# Patient Record
Sex: Male | Born: 1955 | Race: White | Hispanic: No | Marital: Married
Health system: Southern US, Community
[De-identification: ages and names within clinical notes are randomized; demographics above are authoritative.]

---

## 2003-12-28 ENCOUNTER — Ambulatory Visit (HOSPITAL_COMMUNITY): Admission: RE | Admit: 2003-12-28 | Discharge: 2003-12-28 | Payer: Self-pay | Admitting: Family Medicine

## 2004-03-04 ENCOUNTER — Ambulatory Visit: Payer: Self-pay | Admitting: Gastroenterology

## 2004-04-04 ENCOUNTER — Ambulatory Visit: Payer: Self-pay | Admitting: Gastroenterology

## 2004-04-14 ENCOUNTER — Ambulatory Visit: Payer: Self-pay | Admitting: Gastroenterology

## 2004-05-07 ENCOUNTER — Ambulatory Visit: Payer: Self-pay | Admitting: Gastroenterology

## 2004-05-16 ENCOUNTER — Ambulatory Visit (HOSPITAL_COMMUNITY): Admission: RE | Admit: 2004-05-16 | Discharge: 2004-05-16 | Payer: Self-pay | Admitting: Gastroenterology

## 2017-08-03 ENCOUNTER — Ambulatory Visit
Admission: RE | Admit: 2017-08-03 | Discharge: 2017-08-03 | Disposition: A | Discharge status: Home / Self Care | Payer: No Typology Code available for payment source | Source: Ambulatory Visit | Attending: Family Medicine | Admitting: Family Medicine

## 2017-08-03 ENCOUNTER — Other Ambulatory Visit: Payer: Self-pay | Admitting: Family Medicine

## 2017-08-03 DIAGNOSIS — M1990 Unspecified osteoarthritis, unspecified site: Secondary | ICD-10-CM

## 2019-11-02 IMAGING — CR DG HAND COMPLETE 3+V*R*
3 series · 3 of 3 positions shown · non-contrast
Comparison: None.

CLINICAL DATA: Bilateral hand pain for 8 months

EXAM:
RIGHT HAND - COMPLETE 3+ VIEW

[x hand pa right]
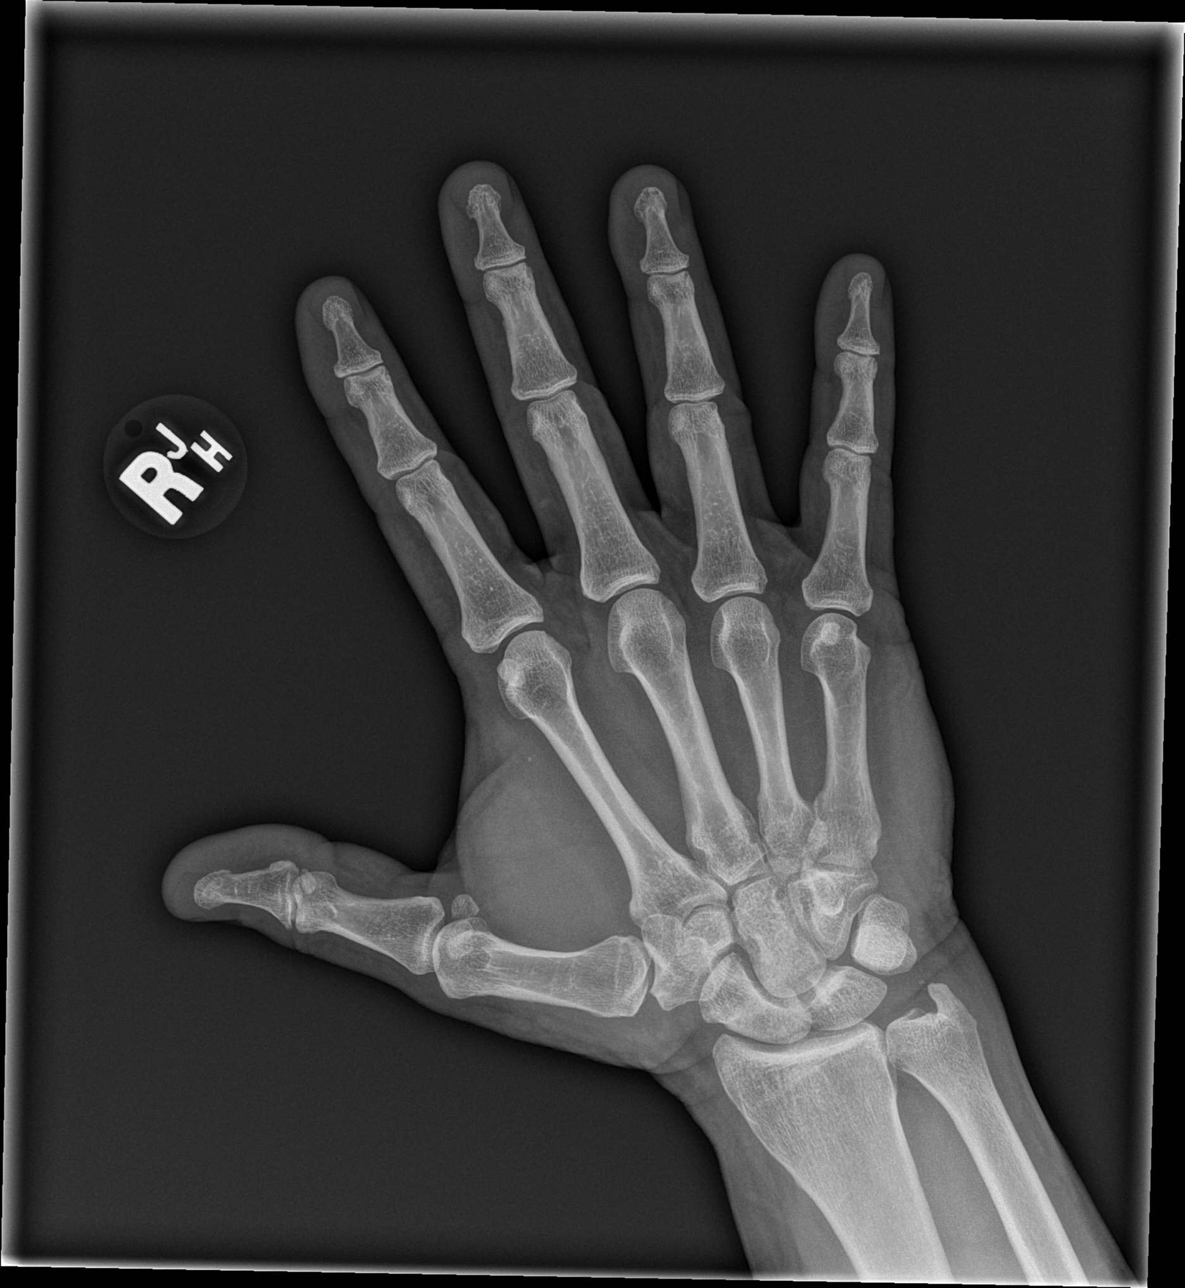

[x hand obl right]
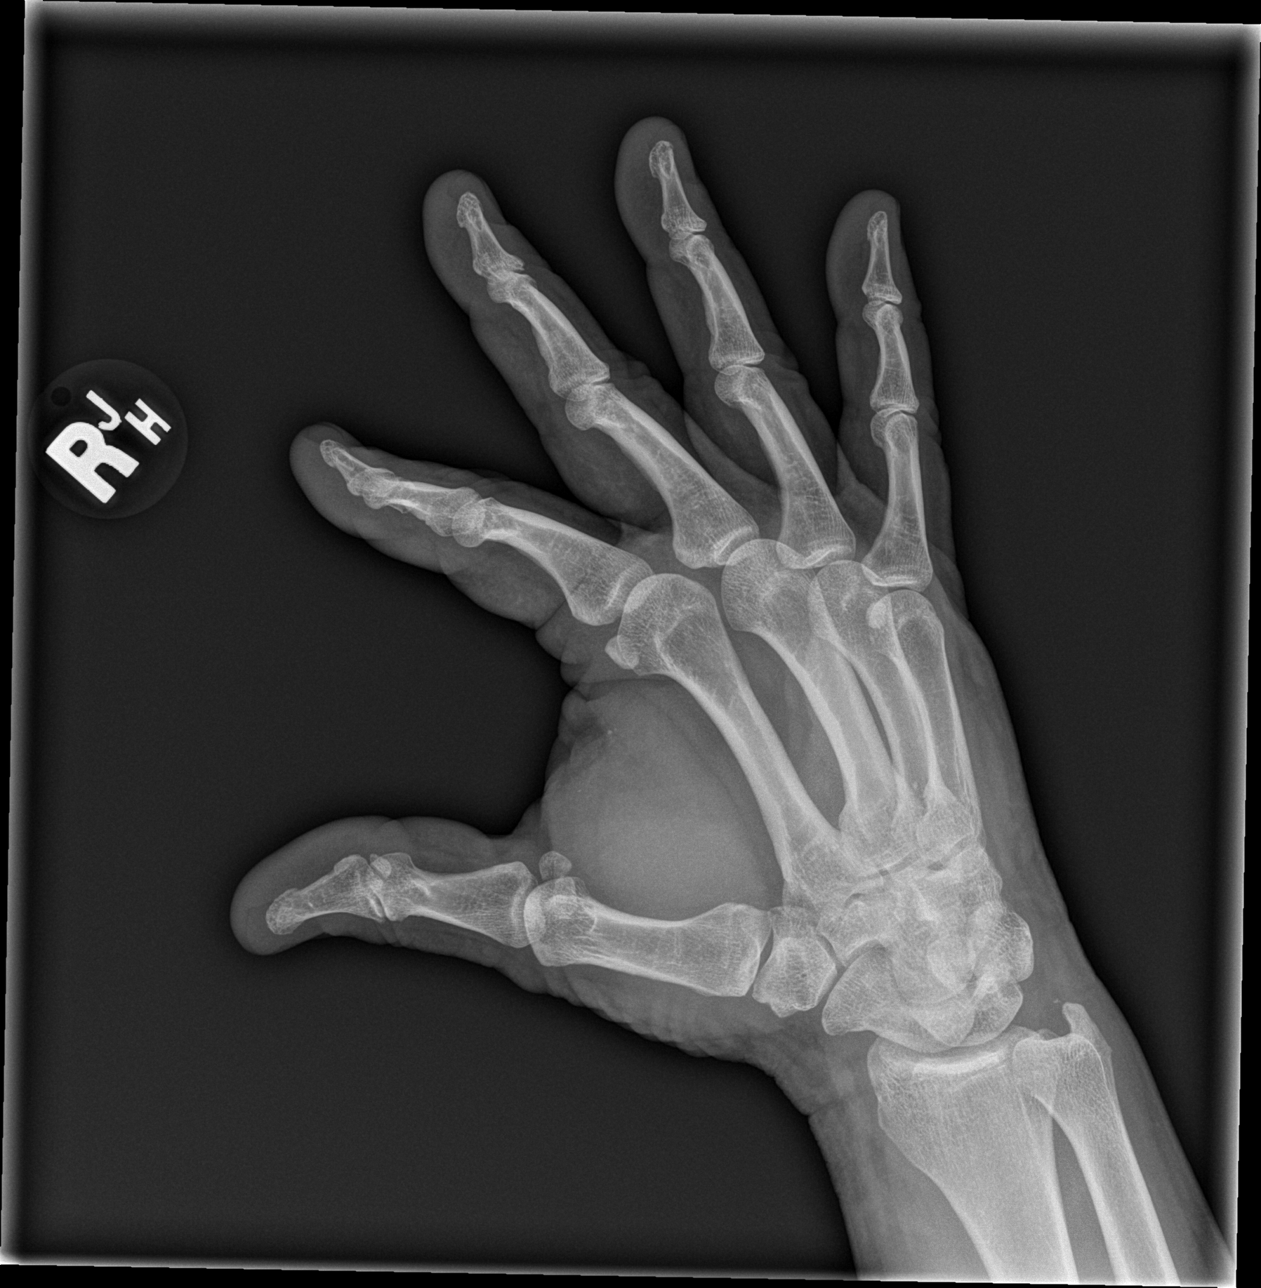

[x hand lat right]
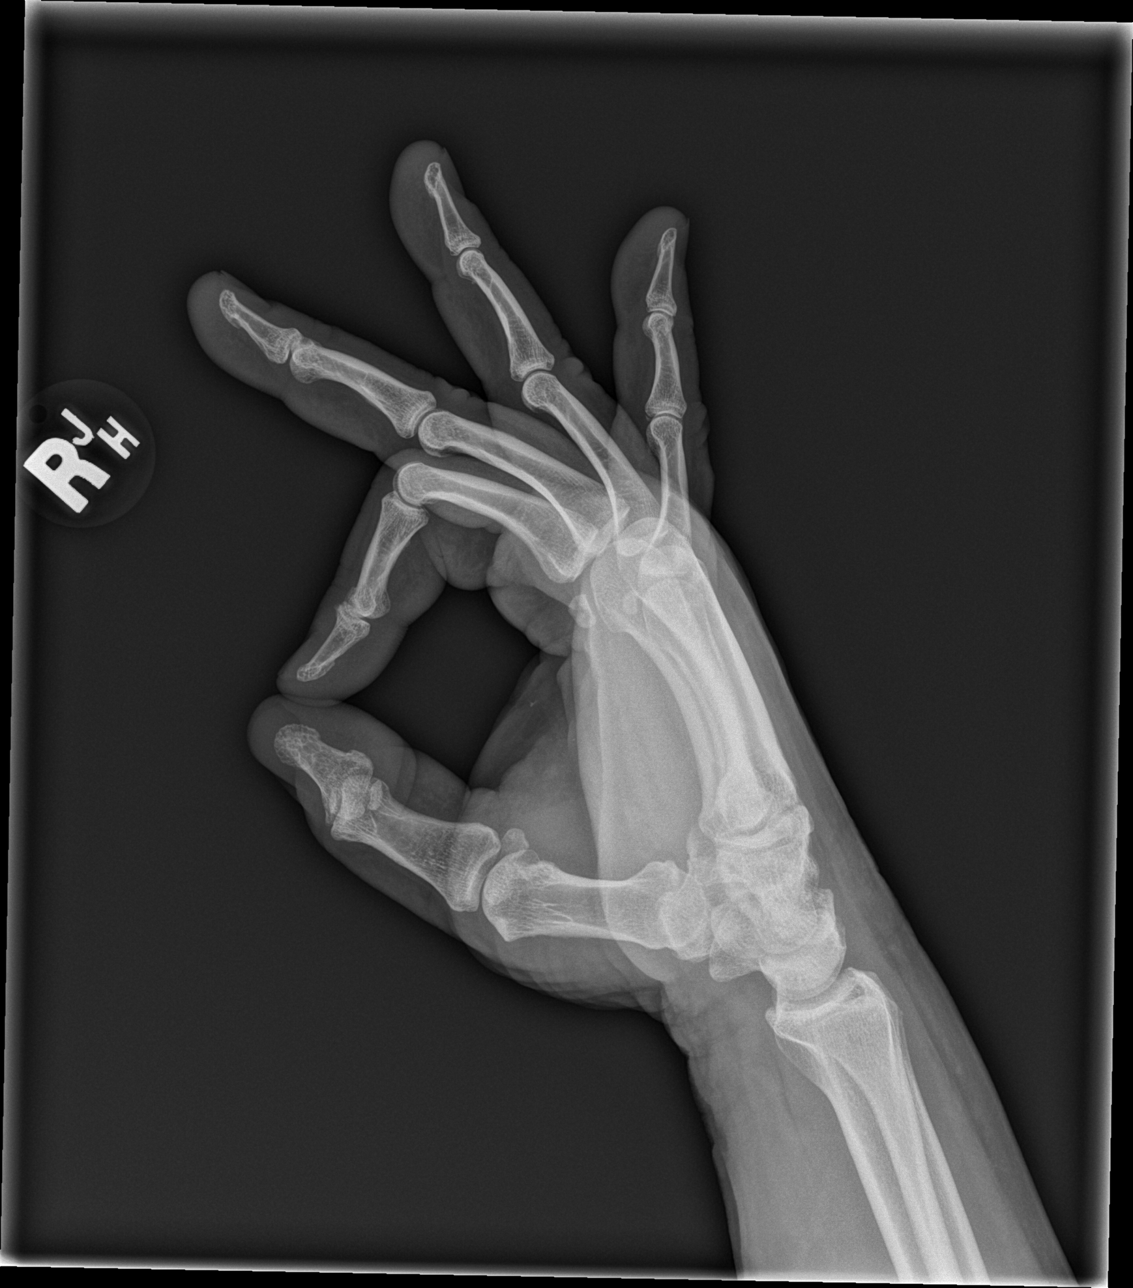

[3 of 3 positions shown; findings below may reference images not displayed]

FINDINGS: There is no evidence of fracture or dislocation. There is no
evidence of arthropathy or other focal bone abnormality. Soft
tissues are unremarkable.
IMPRESSION: Negative.

## 2022-02-12 ENCOUNTER — Emergency Department (HOSPITAL_BASED_OUTPATIENT_CLINIC_OR_DEPARTMENT_OTHER)
Admission: EM | Admit: 2022-02-12 | Discharge: 2022-02-12 | Discharge status: Against Medical Advice | Payer: Medicare Other | Attending: Emergency Medicine | Admitting: Emergency Medicine

## 2022-02-12 ENCOUNTER — Emergency Department (HOSPITAL_BASED_OUTPATIENT_CLINIC_OR_DEPARTMENT_OTHER): Payer: Medicare Other | Admitting: Radiology

## 2022-02-12 ENCOUNTER — Other Ambulatory Visit: Payer: Self-pay

## 2022-02-12 DIAGNOSIS — R059 Cough, unspecified: Secondary | ICD-10-CM | POA: Diagnosis present

## 2022-02-12 DIAGNOSIS — R079 Chest pain, unspecified: Secondary | ICD-10-CM | POA: Diagnosis not present

## 2022-02-12 DIAGNOSIS — R0602 Shortness of breath: Secondary | ICD-10-CM | POA: Diagnosis not present

## 2022-02-12 DIAGNOSIS — Z5321 Procedure and treatment not carried out due to patient leaving prior to being seen by health care provider: Secondary | ICD-10-CM | POA: Diagnosis not present

## 2022-02-12 DIAGNOSIS — Z1152 Encounter for screening for COVID-19: Secondary | ICD-10-CM | POA: Insufficient documentation

## 2022-02-12 LAB — BASIC METABOLIC PANEL
Anion gap: 11 (ref 5–15)
BUN: 16 mg/dL (ref 8–23)
CO2: 23 mmol/L (ref 22–32)
Calcium: 9.4 mg/dL (ref 8.9–10.3)
Chloride: 103 mmol/L (ref 98–111)
Creatinine, Ser: 0.95 mg/dL (ref 0.61–1.24)
GFR, Estimated: >60 mL/min (ref >60)
Glucose, Bld: 88 mg/dL (ref 70–99)
Potassium: 4.2 mmol/L (ref 3.5–5.1)
Sodium: 137 mmol/L (ref 135–145)

## 2022-02-12 LAB — RESP PANEL BY RT-PCR (RSV, FLU A&B, COVID)  RVPGX2
Influenza A by PCR: NEGATIVE
Influenza B by PCR: NEGATIVE
Resp Syncytial Virus by PCR: NEGATIVE
SARS Coronavirus 2 by RT PCR: NEGATIVE

## 2022-02-12 LAB — CBC
HCT: 39.3 % (ref 39.0–52.0)
Hemoglobin: 12.8 g/dL — ABNORMAL LOW (ref 13.0–17.0)
MCH: 28.7 pg (ref 26.0–34.0)
MCHC: 32.6 g/dL (ref 30.0–36.0)
MCV: 88.1 fL (ref 80.0–100.0)
Platelets: 145 10*3/uL — ABNORMAL LOW (ref 150–400)
RBC: 4.46 MIL/uL (ref 4.22–5.81)
RDW: 14.1 % (ref 11.5–15.5)
WBC: 4.2 10*3/uL (ref 4.0–10.5)
nRBC: 0 % (ref 0.0–0.2)

## 2022-02-12 LAB — TROPONIN I (HIGH SENSITIVITY): Troponin I (High Sensitivity): 4 ng/L (ref <18)

## 2022-02-12 NOTE — ED Triage Notes (Signed)
SOB x 3 days, non productive cough. Chest pressure. Denies any fever.

## 2022-02-12 NOTE — ED Notes (Signed)
RT called to triage to assess pt for SOB. Pt BLBS rhonch/dim. Pt congested upper respiratory w/cough. Pt sats 99% on RA at time of assessment. Pt respiratory status stable w/no distress noted at this time.
